# Patient Record
Sex: Female | Born: 1955
Health system: Southern US, Community
[De-identification: ages and names within clinical notes are randomized; demographics above are authoritative.]

---

## 1976-04-04 HISTORY — PX: BREAST BIOPSY: SHX20

## 2003-09-08 ENCOUNTER — Other Ambulatory Visit: Payer: Self-pay

## 2006-04-26 ENCOUNTER — Ambulatory Visit: Payer: Self-pay

## 2006-09-11 ENCOUNTER — Ambulatory Visit: Payer: Self-pay | Admitting: Family Medicine

## 2007-07-06 ENCOUNTER — Encounter: Admission: RE | Admit: 2007-07-06 | Discharge: 2007-09-04 | Payer: Self-pay | Admitting: Anesthesiology

## 2007-07-10 ENCOUNTER — Ambulatory Visit: Payer: Self-pay | Admitting: Anesthesiology

## 2007-09-04 ENCOUNTER — Ambulatory Visit: Payer: Self-pay | Admitting: Anesthesiology

## 2008-06-20 ENCOUNTER — Encounter: Admission: RE | Admit: 2008-06-20 | Discharge: 2008-06-27 | Payer: Self-pay | Admitting: Anesthesiology

## 2008-06-24 ENCOUNTER — Ambulatory Visit: Payer: Self-pay | Admitting: Anesthesiology

## 2008-07-10 ENCOUNTER — Emergency Department: Payer: Self-pay | Admitting: Emergency Medicine

## 2009-03-03 ENCOUNTER — Ambulatory Visit: Payer: Self-pay

## 2009-03-09 ENCOUNTER — Ambulatory Visit: Payer: Self-pay

## 2009-03-19 ENCOUNTER — Ambulatory Visit: Payer: Self-pay | Admitting: Surgery

## 2009-03-26 ENCOUNTER — Ambulatory Visit: Payer: Self-pay

## 2010-03-03 ENCOUNTER — Ambulatory Visit: Payer: Self-pay

## 2010-08-17 NOTE — Assessment & Plan Note (Signed)
Joanna Paul comes to [facility] today to evaluate her.  I  reviewed the health and history form, 14-point review of systems.  Her  permission case manager to the room.   1. Joanna Paul has just completed her school.  She is waiting for another      semester and doing well here.  2. Aquatic therapy will be formalized and continue.  3. We will follow her in a conservative management arena.  We will      discontinue the Flector and Ultram as she had problems with that      and we will go ahead and trial Celebrex as she does well with      NSAIDs, and for the next 2 or 3 months will utilize that as her      primary pain control.  I had also discussed home-based therapy.  I      do not think a TENS unit is necessary, essentially at MMI from my      perspective.   Objectively, her knee is stable.  Good range of motion.  No pseudomotor  changes.  Nothing new neurologically.   IMPRESSION:  Osteoarthritis of the knee.   PLAN:  Conservative management.  Follow it p.r.n.  MMI.           ______________________________  [name], MD     HH/MedQ  D:  09/04/2007 11:09:23  T:  09/04/2007 12:17:29  Job #:  [id number]

## 2010-08-17 NOTE — Assessment & Plan Note (Signed)
Joanna Paul comes to [facility] today.  I evaluated her  and reviewed the Health and History form and 14-point review of systems.  1. She comes today and I examined her, case manager to the room with      her permission.  2. I examined her knees, really not a lot of changes here, I reviewed      available notes from our consulting physicians, and the      orthopedists and it does not appear further surgery is planned; in      fact, she does not really wanted.  3. She has not really changed much in the past year, it has been very      stable and I think from our perspective, she is at MMI, which does      not necessarily need withdrawal care as we explained, but we will      go ahead and continue the Flector, follow her expectantly      essentially p.r.n. and may be transition to primary care for her      Flector patches.  I relate to her primary care should follow liver      function, kidney function for prolonged and said exposure.  4. Other modifiable features in health profile discussed.  She      continues in school.  She would like to go onto her masters and      this is fine, good goal-oriented approach.  We will remain      available for her should she have any problems.   Objectively, the knee reveals no instability, no discoloration, nothing  new neurologically.   IMPRESSION:  Osteoarthritis of the knee.   PLAN:  Conservative management, MMI.  Follow up p.r.n.           ______________________________  [name], MD     HH/MedQ  D:  06/24/2008 11:25:55  T:  06/25/2008 00:25:34  Job #:  [id number]

## 2010-08-17 NOTE — Assessment & Plan Note (Signed)
FOLLOW-UP VISIT:  Joanna Paul comes in for pain management today to  evaluate her with often history performed 14-point review of systems.  She is examined and with her permission, Joanna Griffith, RN, CCM, is  brought to the room.  She is a, the patient is a very pleasant  individual, 55 years old, who apparently was involved in an incident at  work where she injured her right knee, and shin.  7/10, followed by Dr.  [name], and he has completed assessment and evaluation.  She is  referred to [place] for consideration of pain control.  She does not describe  any classic pseudomotor changes.  She has been released from Dr.  [name].  The patient basically is at full duty, and having some  situational pain, underlying diagnosis chondromalacia.  She does not  describe elements of CRTS.  She has had knee injections, and a number of  the conservative treatments, and she is not planned for arthroscopic  evaluation at this time.  She continues to improve slowly.  Her pain is  7/10.  Most activities interfere with her pain.  She has trialed  orthopedist, she has not seen other providers.  She has had x-rays.  It  is described as sometimes burning, sometimes sharp, sometimes stabbing  and stiffness.  The pain is constant.  She is in school, she is a very  active individual, she wants to continue to be very functional.  She  states that she probable wants to go into some kind of law.  She has  adequate mobility.  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  14-point  review of systems and past medical history is otherwise benign.   SOCIAL HISTORY:  She is single, lives with her family.  Otherwise  noncontributory to the pain problem.   FAMILY HISTORY:  Family history is remarkable for diabetes.  Otherwise  noncontributory to the pain problem.   REVIEW OF SYSTEMS:  Otherwise noncontributory to the pain problem.   PHYSICAL EXAMINATION:  GENERAL:  A pleasant female sitting comfortably  in bed.  Gait,  affect, appearance is normal.  Oriented x3.  HEENT:  Is unremarkable.  CHEST:  Clear to auscultation and percussion.  Regular rate and rhythm,  without rub, murmur, or gallop.  ABDOMINAL EXAM:  Soft, nontender, benign.  No hepatosplenomegaly.  She  has diffuse external myofascial discomfort probably from altered gait,  Forton test positive.  EXTREMITIES:  Right knee is stable.  No evidence of pseudomotor changes,  effusion, some patellar discomfort, particularly at full extension.  I  do not see anything new neurologically.  Good peripheral vascular  integrity.   IMPRESSION:  Costochondritis, chondromalacia.   PLAN:  1. Conservative management.  Initiate Flector patch.  Maintain non-      narcotic medication alternatives.  Ultram is chosen.  I would like      to see her get into some aquatic and some therapies that are non-      impact-oriented.  I have reviewed this with her.  2. Follow her expectantly.  I imagine she is pretty much at MMI.  3. Did not plan any further imaging or diagnosis.  Will see how she is      doing in one month and determine further course of care.  Questions      are answered.  Discussed in lay terms.  Reviewed with she and the      case manager.  ______________________________  [name], MD     HH/MedQ  D:  07/10/2007 13:00:28  T:  07/10/2007 16:24:45  Job #:  [id number]

## 2011-03-08 ENCOUNTER — Ambulatory Visit: Payer: Self-pay | Admitting: Family

## 2012-06-27 ENCOUNTER — Ambulatory Visit: Payer: Self-pay | Admitting: Family

## 2012-08-17 ENCOUNTER — Emergency Department: Payer: Self-pay | Admitting: Emergency Medicine

## 2012-08-27 ENCOUNTER — Emergency Department: Payer: Self-pay | Admitting: Emergency Medicine

## 2013-06-11 ENCOUNTER — Ambulatory Visit: Payer: Self-pay | Admitting: Nurse Practitioner

## 2013-06-17 ENCOUNTER — Ambulatory Visit: Payer: Self-pay | Admitting: Nurse Practitioner

## 2013-06-28 ENCOUNTER — Ambulatory Visit: Payer: Self-pay | Admitting: Family

## 2014-03-22 IMAGING — CR DG ABDOMEN 1V
1 series · 2 of 2 positions shown · non-contrast
Comparison: None.

CLINICAL DATA: Abdominal pain, constipation.

EXAM:
ABDOMEN - 1 VIEW

[Series 1: t abdomen supine · 0.14mm/px · 2 of 2 slices shown]
[im 1/2]
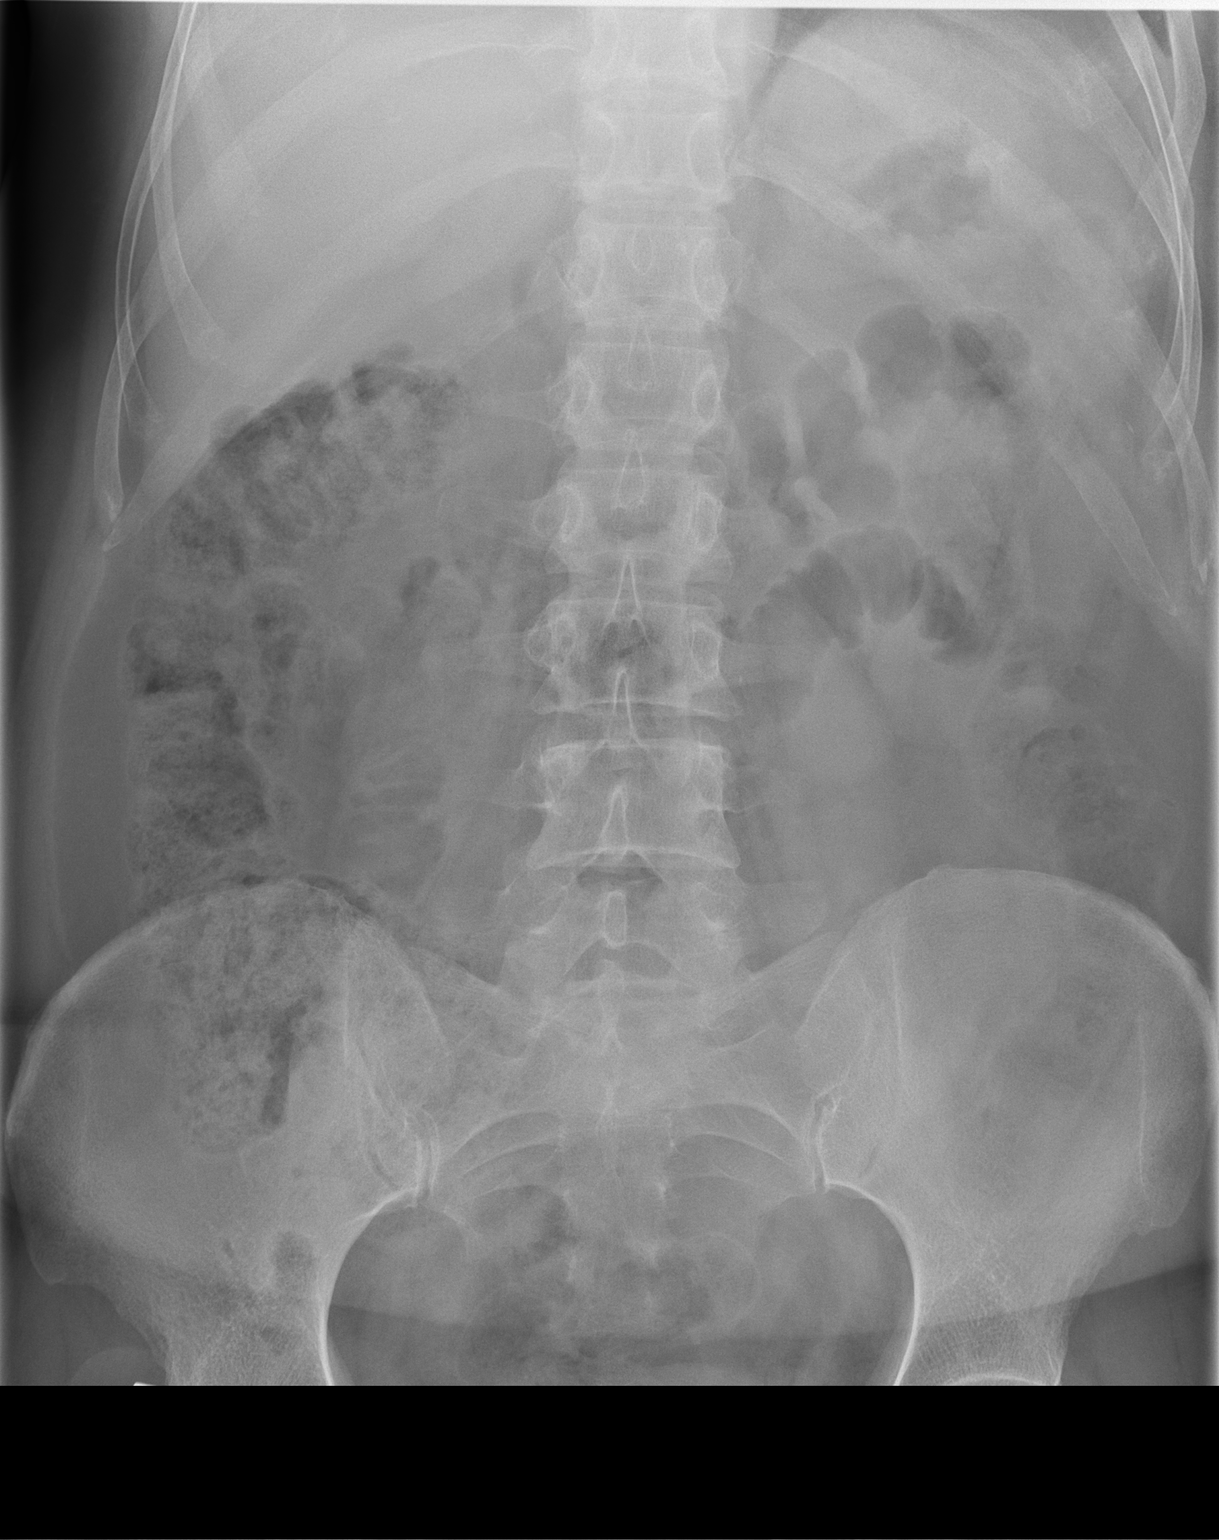
[im 2/2]
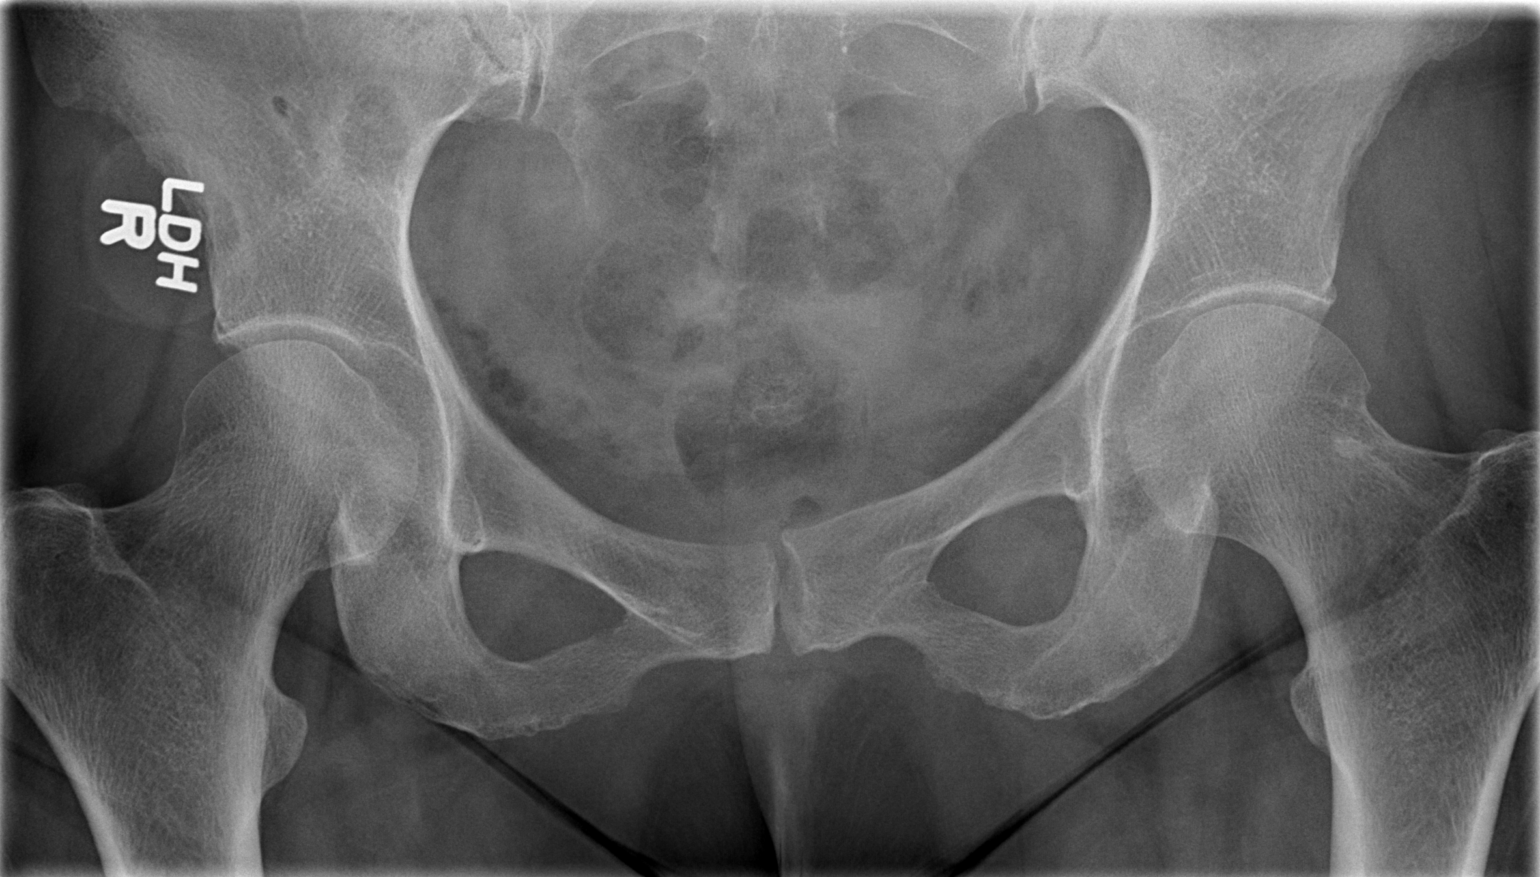

[2 of 2 positions shown; findings below may reference images not displayed]

FINDINGS: Moderate stool burden in the right side of the colon. Gas within
nondistended large and small bowel. No evidence of bowel obstruction
or free air. No organomegaly or suspicious calcification. No acute
bony abnormality.
IMPRESSION: Moderate stool burden in the right colon.  No acute findings.

## 2014-04-19 ENCOUNTER — Encounter (HOSPITAL_COMMUNITY): Payer: Self-pay | Admitting: Cardiology

## 2014-04-19 ENCOUNTER — Emergency Department (HOSPITAL_COMMUNITY)
Admission: EM | Admit: 2014-04-19 | Discharge: 2014-04-19 | Disposition: A | Discharge status: Home / Self Care | Payer: Worker's Compensation | Attending: Emergency Medicine | Admitting: Emergency Medicine

## 2014-04-19 DIAGNOSIS — R531 Weakness: Secondary | ICD-10-CM | POA: Insufficient documentation

## 2014-04-19 DIAGNOSIS — M7989 Other specified soft tissue disorders: Secondary | ICD-10-CM | POA: Insufficient documentation

## 2014-04-19 DIAGNOSIS — R5383 Other fatigue: Secondary | ICD-10-CM | POA: Insufficient documentation

## 2014-04-19 DIAGNOSIS — T50Z95A Adverse effect of other vaccines and biological substances, initial encounter: Secondary | ICD-10-CM | POA: Insufficient documentation

## 2014-04-19 DIAGNOSIS — Y998 Other external cause status: Secondary | ICD-10-CM | POA: Insufficient documentation

## 2014-04-19 DIAGNOSIS — R600 Localized edema: Secondary | ICD-10-CM | POA: Insufficient documentation

## 2014-04-19 DIAGNOSIS — T7840XA Allergy, unspecified, initial encounter: Secondary | ICD-10-CM

## 2014-04-19 DIAGNOSIS — Y9389 Activity, other specified: Secondary | ICD-10-CM | POA: Insufficient documentation

## 2014-04-19 DIAGNOSIS — F419 Anxiety disorder, unspecified: Secondary | ICD-10-CM | POA: Insufficient documentation

## 2014-04-19 DIAGNOSIS — Y9289 Other specified places as the place of occurrence of the external cause: Secondary | ICD-10-CM | POA: Insufficient documentation

## 2014-04-19 DIAGNOSIS — Z880 Allergy status to penicillin: Secondary | ICD-10-CM | POA: Insufficient documentation

## 2014-04-19 MED ORDER — DIPHENHYDRAMINE HCL 25 MG PO TABS: 25.0000 mg | ORAL_TABLET | Freq: Four times a day (QID) | ORAL | Status: AC

## 2014-04-19 MED ORDER — FAMOTIDINE 20 MG PO TABS: 20.0000 mg | ORAL_TABLET | Freq: Two times a day (BID) | ORAL | Status: AC

## 2014-04-19 NOTE — ED Notes (Signed)
Pt reports that she had a hep B vaccine on he 12th and thinks that she may have had a reaction to this. Pt reports that she has felt weak and some calf pain as well.

## 2014-04-19 NOTE — ED Provider Notes (Signed)
CSN: [id number]     Arrival date & time 04/19/14  1842 History   This chart was scribed for non-physician practitioner working with Ethelda Chick, MD by Angelene Giovanni, ED Scribe. The patient was seen in room TR06C/TR06C and the patient's care was started at 7:07 PM     Chief Complaint  Patient presents with  . Allergic Reaction   The history is provided by the patient. No language interpreter was used.   HPI Comments: Joanna Paul is a 59 y.o. female who presents to the Emergency Department complaining of a possible allergic reaction to a Hepatitis B vaccine that she received 4 days ago. She reports associated facial swelling onset the day after receiving the vaccine. She states that she noticed leg and feet swelling and pain with "small red dots" all over her leg. She also reports a sensation of a swollen tongue and weakness. She reports taking Benadryl yesterday which resolved the spots on the leg. She denies wheezing or difficulty breathing. She adds that she could possibly be allergic to Penicillin. She denies any food allergies. She states that whenever she gets a Tetanus shot, her arm is swollen and red.  History reviewed. No pertinent past medical history. History reviewed. No pertinent past surgical history. History reviewed. No pertinent family history. History  Substance Use Topics  . Smoking status: Never Smoker   . Smokeless tobacco: Not on file  . Alcohol Use: No   OB History    No data available     Review of Systems  Constitutional: Positive for fatigue. Negative for fever.  HENT: Negative for facial swelling and trouble swallowing.   Eyes: Negative for redness.  Respiratory: Negative for shortness of breath, wheezing and stridor.   Cardiovascular: Positive for leg swelling. Negative for chest pain.  Gastrointestinal: Negative for nausea and vomiting.  Musculoskeletal: Negative for myalgias.  Skin: Positive for rash.  Neurological: Negative for light-headedness.   Psychiatric/Behavioral: Negative for confusion.      Allergies  Penicillins  Home Medications   Prior to Admission medications   Not on File   BP 141/77 mmHg  Pulse 83  Temp(Src) 97.9 F (36.6 C)  Resp 18  SpO2 93% Physical Exam  Constitutional: She is oriented to person, place, and time. She appears well-developed and well-nourished. No distress.  HENT:  Head: Normocephalic and atraumatic.  Mouth/Throat: Oropharynx is clear and moist.  No tongue swelling or angioedema.  Eyes: Conjunctivae and EOM are normal. Pupils are equal, round, and reactive to light.  Neck: Normal range of motion. Neck supple. No tracheal deviation present.  Cardiovascular: Normal rate.   Pulmonary/Chest: Effort normal. No respiratory distress.  Musculoskeletal: Normal range of motion. She exhibits no edema or tenderness.  Bilateral lower extremities: No lower extremity edema. No significant tenderness. No signs of cellulitis. No urticaria or petechiae noted.  Neurological: She is alert and oriented to person, place, and time.  Skin: Skin is warm and dry.  Psychiatric: Her behavior is normal. Her mood appears anxious.  Nursing note and vitals reviewed.   ED Course  Procedures (including critical care time) DIAGNOSTIC STUDIES: Oxygen Saturation is 93% on RA, adequate by my interpretation.    COORDINATION OF CARE: 7:16 PM- Pt advised of plan for treatment and pt agrees.    Labs Review Labs Reviewed - No data to display  Imaging Review No results found.   EKG Interpretation None       7:22 PM Patient seen and examined. Will treat  with Benadryl and Pepcid 3 days.   Vital signs reviewed and are as follows: BP 141/77 mmHg  Pulse 83  Temp(Src) 97.9 F (36.6 C)  Resp 18  SpO2 93%  Patient encouraged to call 911 if she has trouble breathing or noticeable tongue swelling.   MDM   Final diagnoses:  Allergic reaction, initial encounter   Patient with possible allergic reaction to  her recent hepatitis B vaccine. Currently no objective symptoms on exam. Patient's symptoms consisted of rash, subjective tongue swelling. Patient never had any shortness of breath or difficulty breathing. I do not think that this represents a delayed anaphylactic reaction.  Patient has complained of calf tenderness and swelling with rash. She currently has no signs and symptoms of DVT or cellulitis. I do not feel that an ultrasound is needed at this time. No h/o DVT. No hormone use.   I personally performed the services described in this documentation, which was scribed in my presence. The recorded information has been reviewed and is accurate.     Renne CriglerJoshua Arhaan Chesnut, PA-C 04/19/14 1924  Ethelda ChickMartha K Linker, MD 04/19/14 [phone]

## 2014-04-19 NOTE — Discharge Instructions (Signed)
Please read and follow all provided instructions.  Your diagnoses today include:  1. Allergic reaction, initial encounter    Tests performed today include:  Vital signs. See below for your results today.   Medications prescribed:   Benadryl (diphenhydramine) - antihistamine  You can find this medication over-the-counter.   DO NOT exceed:   50mg  Benadryl every 6 hours    Benadryl will make you drowsy. DO NOT drive or perform any activities that require you to be awake and alert if taking this.   Pepcid (famotidine) - antihistamine  You can find this medication over-the-counter.   DO NOT exceed:   20mg  Pepcid every 12 hours  Take any prescribed medications only as directed.  Home care instructions:   Follow any educational materials contained in this packet  Follow-up instructions: Please follow-up with your primary care provider in the next 3 days for further evaluation of your symptoms.   Return instructions:   Please return to the Emergency Department if you experience worsening symptoms.   Call 9-1-1 immediately if you have an allergic reaction that involves your lips, mouth, throat or if you have any difficulty breathing. This is a life-threatening emergency.   Please return if you have any other emergent concerns.  Additional Information:  Your vital signs today were: BP 141/77 mmHg   Pulse 83   Temp(Src) 97.9 F (36.6 C)   Resp 18   SpO2 93% If your blood pressure (BP) was elevated above 135/85 this visit, please have this repeated by your doctor within one month. --------------

## 2014-11-10 ENCOUNTER — Emergency Department
Admission: EM | Admit: 2014-11-10 | Discharge: 2014-11-10 | Disposition: A | Discharge status: Home / Self Care | Payer: Worker's Compensation | Attending: Emergency Medicine | Admitting: Emergency Medicine

## 2014-11-10 ENCOUNTER — Emergency Department: Payer: Worker's Compensation

## 2014-11-10 ENCOUNTER — Encounter: Payer: Self-pay | Admitting: Emergency Medicine

## 2014-11-10 DIAGNOSIS — Y990 Civilian activity done for income or pay: Secondary | ICD-10-CM | POA: Diagnosis not present

## 2014-11-10 DIAGNOSIS — J0100 Acute maxillary sinusitis, unspecified: Secondary | ICD-10-CM | POA: Insufficient documentation

## 2014-11-10 DIAGNOSIS — W51XXXA Accidental striking against or bumped into by another person, initial encounter: Secondary | ICD-10-CM | POA: Insufficient documentation

## 2014-11-10 DIAGNOSIS — Y9389 Activity, other specified: Secondary | ICD-10-CM | POA: Insufficient documentation

## 2014-11-10 DIAGNOSIS — Y92128 Other place in nursing home as the place of occurrence of the external cause: Secondary | ICD-10-CM | POA: Diagnosis not present

## 2014-11-10 DIAGNOSIS — Z880 Allergy status to penicillin: Secondary | ICD-10-CM | POA: Insufficient documentation

## 2014-11-10 DIAGNOSIS — Z79899 Other long term (current) drug therapy: Secondary | ICD-10-CM | POA: Insufficient documentation

## 2014-11-10 DIAGNOSIS — S0083XA Contusion of other part of head, initial encounter: Secondary | ICD-10-CM | POA: Diagnosis not present

## 2014-11-10 DIAGNOSIS — S0993XA Unspecified injury of face, initial encounter: Secondary | ICD-10-CM | POA: Diagnosis present

## 2014-11-10 MED ORDER — LEVOFLOXACIN 500 MG PO TABS: 500.0000 mg | ORAL_TABLET | Freq: Every day | ORAL | Status: AC

## 2014-11-10 MED ORDER — IBUPROFEN 800 MG PO TABS: 800.0000 mg | ORAL_TABLET | Freq: Three times a day (TID) | ORAL | Status: DC | PRN

## 2014-11-10 MED ORDER — PSEUDOEPHEDRINE HCL 60 MG PO TABS
60.0000 mg | ORAL_TABLET | ORAL | Status: AC | PRN
Start: 2014-11-10 — End: ?

## 2014-11-10 MED ORDER — LIDOCAINE VISCOUS 2 % MT SOLN: 20.0000 mL | OROMUCOSAL | Status: AC | PRN
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
## 2014-11-10 NOTE — Discharge Instructions (Signed)
Contusion A contusion is a deep bruise. Contusions are the result of an injury that caused bleeding under the skin. The contusion may turn blue, purple, or yellow. Minor injuries will give you a painless contusion, but more severe contusions may stay painful and swollen for a few weeks.  CAUSES  A contusion is usually caused by a blow, trauma, or direct force to an area of the body. SYMPTOMS   Swelling and redness of the injured area.  Bruising of the injured area.  Tenderness and soreness of the injured area.  Pain. DIAGNOSIS  The diagnosis can be made by taking a history and physical exam. An X-ray, CT scan, or MRI may be needed to determine if there were any associated injuries, such as fractures. TREATMENT  Specific treatment will depend on what area of the body was injured. In general, the best treatment for a contusion is resting, icing, elevating, and applying cold compresses to the injured area. Over-the-counter medicines may also be recommended for pain control. Ask your caregiver what the best treatment is for your contusion. HOME CARE INSTRUCTIONS   Put ice on the injured area.  Put ice in a plastic bag.  Place a towel between your skin and the bag.  Leave the ice on for 15-20 minutes, 3-4 times a day, or as directed by your health care provider.  Only take over-the-counter or prescription medicines for pain, discomfort, or fever as directed by your caregiver. Your caregiver may recommend avoiding anti-inflammatory medicines (aspirin, ibuprofen, and naproxen) for 48 hours because these medicines may increase bruising.  Rest the injured area.  If possible, elevate the injured area to reduce swelling. SEEK IMMEDIATE MEDICAL CARE IF:   You have increased bruising or swelling.  You have pain that is getting worse.  Your swelling or pain is not relieved with medicines. MAKE SURE YOU:   Understand these instructions.  Will watch your condition.  Will get help right  away if you are not doing well or get worse. Document Released: 12/29/2004 Document Revised: 03/26/2013 Document Reviewed: 01/24/2011 [facility] Patient Information 2015 [place], [place]. This information is not intended to replace advice given to you by your health care provider. Make sure you discuss any questions you have with your health care provider.  Mandibular Contusion A mandibular contusion is a deep bruise of your jaw. Contusions are the result of an injury that caused bleeding under the skin. The contusion may turn blue, purple, or yellow. Minor injuries will give you a painless contusion, but more severe contusions may stay painful and swollen for a few weeks.  CAUSES A mandibular contusion comes from a direct force to that area, such as falling or a punch to the jaw. SYMPTOMS   Jaw pain.  Jaw swelling.  Jaw bruising.  Jaw tenderness. DIAGNOSIS  The diagnosis can be made by taking your history and doing a physical exam. You may need an X-ray of your jaw to look for a broken bone (fracture). TREATMENT Often, the best treatment for a mandibular contusion is applying cold compresses to the injured area and eating a soft diet. Over-the-counter medicines may also be recommended for pain control.  HOME CARE INSTRUCTIONS   Put ice on the injured area.  Put ice in a plastic bag.  Place a towel between your skin and the bag.  Leave the ice on for 15-20 minutes, 03-04 times a day.  Eat soft foods for 1 week. Soft foods include baby food, gelatin, cooked cereal, ice cream, applesauce,  ice in a plastic bag.  ¨ Place a towel between your skin and the bag.  ¨ Leave the ice on for 15-20 minutes, 03-04 times a day.  · Eat soft foods for 1 week. Soft foods include baby food, gelatin, cooked cereal, ice cream, applesauce, bananas, eggs, pasta, cottage cheese, soups, and yogurt. Cut food into smaller pieces for less chewing. Avoid chewing gum or ice.  · Only take over-the-counter or prescription medicines for pain, discomfort, or fever as directed by your caregiver.  · Avoid opening your mouth widely. This includes opening your mouth to eat large pieces of food or to yawn, scream, yell, or sing.  SEEK IMMEDIATE MEDICAL CARE  IF:   · Your swelling or pain is not relieved with medicines.  · You are not improving.  · You have any cracking or clicking (crepitation) in the jaw joint.  MAKE SURE YOU:   · Understand these instructions.  · Will watch your condition.  · Will get help right away if you are not doing well or get worse.  Document Released: 06/11/2003 Document Revised: 06/13/2011 Document Reviewed: 02/04/2011  [employer]® Patient Information ©2015 [employer]. This information is not intended to replace advice given to you by your health care provider. Make sure you discuss any questions you have with your health care provider.

## 2016-02-23 ENCOUNTER — Telehealth (INDEPENDENT_AMBULATORY_CARE_PROVIDER_SITE_OTHER): Payer: Self-pay | Admitting: Orthopedic Surgery

## 2016-02-23 NOTE — Telephone Encounter (Signed)
Joanna Paul w/[employer] ph# [phone], fax# [phone] is the claim adjuster for this patient. Always include claim number in all messages, claim# [id number]. Patient has an appointment tomorrow with Joanna Paul at 2:00

## 2016-02-24 ENCOUNTER — Encounter (INDEPENDENT_AMBULATORY_CARE_PROVIDER_SITE_OTHER): Payer: Self-pay | Admitting: Orthopedic Surgery

## 2016-02-24 ENCOUNTER — Ambulatory Visit (INDEPENDENT_AMBULATORY_CARE_PROVIDER_SITE_OTHER): Payer: Worker's Compensation | Admitting: Orthopedic Surgery

## 2016-02-24 ENCOUNTER — Ambulatory Visit (INDEPENDENT_AMBULATORY_CARE_PROVIDER_SITE_OTHER): Payer: Self-pay

## 2016-02-24 VITALS — Ht 64.0 in | Wt 155.0 lb

## 2016-02-24 DIAGNOSIS — S92355A Nondisplaced fracture of fifth metatarsal bone, left foot, initial encounter for closed fracture: Secondary | ICD-10-CM | POA: Diagnosis not present

## 2016-02-24 DIAGNOSIS — M79672 Pain in left foot: Secondary | ICD-10-CM | POA: Diagnosis not present

## 2016-02-24 DIAGNOSIS — M25572 Pain in left ankle and joints of left foot: Secondary | ICD-10-CM | POA: Diagnosis not present

## 2016-02-24 NOTE — Progress Notes (Signed)
Office Visit Note   Patient: Joanna Paul           Date of Birth: Aug 27, 1955           MRN: [id number] Visit Date: 02/24/2016              Requested by: No referring provider defined for this encounter. PCP: No PCP Per Patient   Assessment & Plan: Visit Diagnoses:  1. Pain in left foot   2. Pain in left ankle and joints of left foot   3. Closed nondisplaced fracture of fifth metatarsal bone of left foot, initial encounter     Plan: Plan for the fracture boot weightbearing as tolerated. Patient is to be out of work from her regular duties for 4 weeks but she may do seated light-duty work. She is given advice regarding elevation ice anti-inflammatories  Repeat 3 view radiographs of the left foot at follow-up.  Follow-Up Instructions: Return in about 4 weeks (around 03/23/2016).   Orders:  Orders Placed This Encounter  Procedures  . XR Ankle Complete Left  . XR Foot Complete Left   No orders of the defined types were placed in this encounter.     Procedures: No procedures performed   Clinical Data: No additional findings.   Subjective: Chief Complaint  Patient presents with  . Left Ankle - Fracture    Patient is here for left ankle evaluation. She had on the job injury while walking, she fell to the floor and felt like her bone was shifting downward. She went to [facility] on date of injury 02/14/16. Xrays were obtained, and she was advised she had left ankle fracture. She was placed in a cam walker, given crutches and advised to be nonweightbearing. She is weightbearing on foot in exam room today, she states she tries her best to be compliant.   Patient states she was walking down the hall at work she twisted and then felt a pop in the lateral aspect of her left foot.  Review of Systems   Objective: Vital Signs: Ht 5\' 4"  (1.626 m)   Wt 155 lb (70.3 kg)   BMI 26.61 kg/m   Physical Exam on examination patient is alert oriented no adenopathy  well-dressed normal affect normal respiratory effort she does have an antalgic gait she has good pulses she has ecchymosis and bruising over the lateral aspect the left foot. Her ankle is essentially nontender to palpation. Anterior drawer stable she has a good pulse. She is point tender to palpation over the fifth metatarsal shaft. This is the area of bruising and swelling. This is the area of the old fracture through the left fifth metatarsal shaft.  Ortho Exam  Specialty Comments:  No specialty comments available.  Imaging: Xr Ankle Complete Left  Result Date: 02/24/2016 Three-view radiographs the left ankle shows a congruent mortise no evidence of fracture she does have a sesamoid over the distal aspect of the fibula but no signs of any avulsion acute fracture.  Xr Foot Complete Left  Result Date: 02/24/2016 Three-view radiographs of the left foot shows a old diaphyseal fracture of the fifth metatarsal. There is no displacement at this time.    PMFS History: Patient Active Problem List   Diagnosis Date Noted  . Closed nondisplaced fracture of fifth left metatarsal bone 02/24/2016   History reviewed. No pertinent past medical history.  History reviewed. No pertinent family history.  History reviewed. No pertinent surgical history. Social History   Occupational  History  . Not on file.   Social History Main Topics  . Smoking status: Never Smoker  . Smokeless tobacco: Never Used  . Alcohol use No  . Drug use: No  . Sexual activity: Not on file

## 2016-02-24 NOTE — Telephone Encounter (Signed)
Patient is low on naproxen (prescribed by another dr.at [facility]) She is requesting a refill  [facility] [address]                              [place]   Cb#: [phone]

## 2016-02-29 ENCOUNTER — Other Ambulatory Visit (INDEPENDENT_AMBULATORY_CARE_PROVIDER_SITE_OTHER): Payer: Self-pay | Admitting: Orthopedic Surgery

## 2016-02-29 MED ORDER — NAPROXEN 500 MG PO TABS: 500.0000 mg | ORAL_TABLET | Freq: Two times a day (BID) | ORAL | 3 refills | Status: DC

## 2016-02-29 NOTE — Telephone Encounter (Signed)
rx written

## 2016-03-01 NOTE — Telephone Encounter (Signed)
Facility called this morning to let Dr Lajoyce Cornersuda know patient needs a naproxen refill. [facility] [address]  Cb# [phone]

## 2016-03-01 NOTE — Telephone Encounter (Signed)
I called and left voicemail this has been addressed and sent into her pharmacy yesterday evening. If they have not received asked she please call back and let us know.

## 2016-03-21 ENCOUNTER — Ambulatory Visit (INDEPENDENT_AMBULATORY_CARE_PROVIDER_SITE_OTHER): Payer: Self-pay

## 2016-03-21 ENCOUNTER — Ambulatory Visit (INDEPENDENT_AMBULATORY_CARE_PROVIDER_SITE_OTHER): Payer: Worker's Compensation | Admitting: Family

## 2016-03-21 VITALS — Ht 64.0 in | Wt 155.0 lb

## 2016-03-21 DIAGNOSIS — M79672 Pain in left foot: Secondary | ICD-10-CM | POA: Diagnosis not present

## 2016-03-21 DIAGNOSIS — M7672 Peroneal tendinitis, left leg: Secondary | ICD-10-CM

## 2016-03-21 DIAGNOSIS — S92355D Nondisplaced fracture of fifth metatarsal bone, left foot, subsequent encounter for fracture with routine healing: Secondary | ICD-10-CM | POA: Diagnosis not present

## 2016-03-21 DIAGNOSIS — S90121A Contusion of right lesser toe(s) without damage to nail, initial encounter: Secondary | ICD-10-CM | POA: Diagnosis not present

## 2016-03-21 DIAGNOSIS — M79671 Pain in right foot: Secondary | ICD-10-CM

## 2016-03-21 MED ORDER — GLUCOSAMINE-CHONDROITIN 500-400 MG PO TABS: 1.0000 | ORAL_TABLET | Freq: Three times a day (TID) | ORAL | 1 refills | Status: AC

## 2016-03-21 MED ORDER — IBUPROFEN 800 MG PO TABS: 800.0000 mg | ORAL_TABLET | Freq: Three times a day (TID) | ORAL | 0 refills | Status: AC | PRN

## 2016-03-21 NOTE — Progress Notes (Signed)
Office Visit Note   Patient: Joanna BaasKathy A Peragine           Date of Birth: 1956-04-04           MRN: [id number] Visit Date: 03/21/2016              Requested by: No referring provider defined for this encounter. PCP: No PCP Per Patient   Assessment & Plan: Visit Diagnoses:  1. Pain in left foot   2. Peroneal tendinitis of lower leg, left   3. Pain in right foot   4. Contusion of fifth toe of right foot, initial encounter   5. Closed nondisplaced fracture of fifth metatarsal bone of left foot with routine healing, subsequent encounter     Plan: Will get her set up for MRI left ankle, rule out peroneal tendon tear. She will continue the fracture boot. Follow up in the office post MRI. Recommended a stiff soled shoe for the right foot.   Follow-Up Instructions: Return p mri .   Orders:  Orders Placed This Encounter  Procedures  . XR Foot Complete Left  . XR Foot Complete Right   Meds ordered this encounter  Medications  . ibuprofen (ADVIL,MOTRIN) 800 MG tablet    Sig: Take 1 tablet (800 mg total) by mouth every 8 (eight) hours as needed.    Dispense:  60 tablet    Refill:  0  . glucosamine-chondroitin (MAX GLUCOSAMINE CHONDROITIN) 500-400 MG tablet    Sig: Take 1 tablet by mouth 3 (three) times daily.    Dispense:  90 tablet    Refill:  1      Procedures: No procedures performed   Clinical Data: No additional findings.   Subjective: Chief Complaint  Patient presents with  . Left Foot - Injury    Closed nondisplaced fracture of fifth metatarsal bone of left foot, initial encounter  DOI 02/29/16    Patient is ambulating with crutches and a fracture boot. States that she was walking with crutches and her foot gave out and she injured her right foot as well. Work comp said that this may be x rayed today as well. She complains of lateral side foot pain and states that she is " 99.9 % sure that its broken" patient does not voice any concerns about the other  side.   Injury     Review of Systems  Constitutional: Negative for chills and fever.     Objective: Vital Signs: Ht 5\' 4"  (1.626 m)   Wt 155 lb (70.3 kg)   BMI 26.61 kg/m   Physical Exam  Constitutional: She is oriented to person, place, and time. She appears well-developed and well-nourished.  Pulmonary/Chest: Effort normal.  Musculoskeletal:       Left ankle: She exhibits swelling. Tenderness. AITFL tenderness found.       Feet:  Tenderness over ATFL and peroneal tendons. Peroneal tendons most painful. There is swelling. No ecchymosis. Pain with resisted eversion.  Neurological: She is alert and oriented to person, place, and time.  Psychiatric: She has a normal mood and affect.  Nursing note reviewed.   Ortho Exam  Specialty Comments:  No specialty comments available.  Imaging: Xr Foot Complete Left  Result Date: 03/21/2016 Three-view radiographs of the left foot are unchanged from prior exam, show a old diaphyseal fracture of the fifth metatarsal. There is no displacement at this time.  Xr Foot Complete Right  Result Date: 03/21/2016 Three view radiographs of the right foot show a  close fracture of the 5th toe proximal phalange without displacement.     PMFS History: Patient Active Problem List   Diagnosis Date Noted  . Closed nondisplaced fracture of fifth left metatarsal bone 02/24/2016   No past medical history on file.  No family history on file.  No past surgical history on file. Social History   Occupational History  . Not on file.   Social History Main Topics  . Smoking status: Never Smoker  . Smokeless tobacco: Never Used  . Alcohol use No  . Drug use: No  . Sexual activity: Not on file

## 2016-03-22 ENCOUNTER — Telehealth (INDEPENDENT_AMBULATORY_CARE_PROVIDER_SITE_OTHER): Payer: Self-pay | Admitting: Orthopedic Surgery

## 2016-03-22 ENCOUNTER — Other Ambulatory Visit (INDEPENDENT_AMBULATORY_CARE_PROVIDER_SITE_OTHER): Payer: Self-pay

## 2016-03-22 MED ORDER — NAPROXEN 500 MG PO TABS: 500.0000 mg | ORAL_TABLET | Freq: Two times a day (BID) | ORAL | 3 refills | Status: AC

## 2016-03-22 NOTE — Telephone Encounter (Signed)
I called and lm on vm for pt to advise that this has been faxed into her pharm.

## 2016-03-22 NOTE — Telephone Encounter (Signed)
Faxed [phone] attn:[facility]

## 2016-03-22 NOTE — Telephone Encounter (Signed)
[facility] mgr) fax [phone]

## 2016-03-22 NOTE — Telephone Encounter (Signed)
PATIENT IS REQUESTING REFILL OF NAPROXEN.  [facility] [address] [facility] [place]  Cb#: [phone]

## 2016-03-23 ENCOUNTER — Other Ambulatory Visit (INDEPENDENT_AMBULATORY_CARE_PROVIDER_SITE_OTHER): Payer: Self-pay | Admitting: Family

## 2016-03-23 DIAGNOSIS — M25562 Pain in left knee: Secondary | ICD-10-CM

## 2016-03-29 ENCOUNTER — Ambulatory Visit
Admission: RE | Admit: 2016-03-29 | Discharge: 2016-03-29 | Disposition: A | Discharge status: Home / Self Care | Payer: Worker's Compensation | Source: Ambulatory Visit | Attending: Family | Admitting: Family

## 2016-03-29 DIAGNOSIS — M25562 Pain in left knee: Secondary | ICD-10-CM

## 2016-04-12 ENCOUNTER — Ambulatory Visit (INDEPENDENT_AMBULATORY_CARE_PROVIDER_SITE_OTHER): Payer: Worker's Compensation | Admitting: Orthopedic Surgery

## 2016-04-12 VITALS — Ht 64.0 in | Wt 155.0 lb

## 2016-04-12 DIAGNOSIS — S93412A Sprain of calcaneofibular ligament of left ankle, initial encounter: Secondary | ICD-10-CM | POA: Insufficient documentation

## 2016-04-12 NOTE — Progress Notes (Signed)
   Office Visit Note   Patient: Joanna Paul           Date of Birth: [birth year]           MRN: [id number] Visit Date: 04/12/2016              Requested by: No referring provider defined for this encounter. PCP: No PCP Per Patient  Chief Complaint  Patient presents with  . Left Ankle - Follow-up    MRI review    HPI: MRI review of left ankle. This is a workers comp case. The pt is walking full weight bearing holding her crutches in a fracture boot.   Autumn L Forrest, RMA   Patient states she was unable to get into her cowboy boots. She complains of pain over the anterior talofibular ligament region. No complaints in the forefoot on either foot. Assessment & Plan: Visit Diagnoses:  1. Sprain of calcaneofibular ligament of left ankle, initial encounter     Plan: We'll plan to advance to an ASO. Patient may increase her activities as tolerated without restrictions. Follow-up as needed. Case was discussed with the workman's comp case Production designer, theatre/television/filmmanager. Patient is given a prescription for physical therapy in [place] at [facility] for range of motion modalities strengthening left ankle 3 times a week for 3 weeks with return to work in 3 weeks without restrictions.  Follow-Up Instructions: Return if symptoms worsen or fail to improve.   Ortho Exam On examination patient is alert oriented no adenopathy well-dressed normal affect normal respiratory effort she ambulates with holding her crutches in 1 hand without using them. Examination she has a stable anterior drawer she has good pulses the deltoid ligament is nontender to palpation the tibia and fibula are nontender to palpation she is tender to palpation of the anterior talofibular ligament. Review of the MRI scan shows some edema in the anterior talofibular ligament complex. The remaining tendons and joint are normal no evidence of fractures. No evidence of an osteochondral defect. No joint effusion. No tendon injury.  Imaging: No  results found.  Orders:  No orders of the defined types were placed in this encounter.  No orders of the defined types were placed in this encounter.    Procedures: No procedures performed  Clinical Data: No additional findings.  Subjective: Review of Systems  Objective: Vital Signs: Ht 5\' 4"  (1.626 m)   Wt 155 lb (70.3 kg)   BMI 26.61 kg/m   Specialty Comments:  No specialty comments available.  PMFS History: Patient Active Problem List   Diagnosis Date Noted  . Sprain of calcaneofibular ligament of left ankle 04/12/2016  . Closed nondisplaced fracture of fifth left metatarsal bone 02/24/2016   No past medical history on file.  No family history on file.  No past surgical history on file. Social History   Occupational History  . Not on file.   Social History Main Topics  . Smoking status: Never Smoker  . Smokeless tobacco: Never Used  . Alcohol use No  . Drug use: No  . Sexual activity: Not on file

## 2016-05-19 ENCOUNTER — Ambulatory Visit (INDEPENDENT_AMBULATORY_CARE_PROVIDER_SITE_OTHER): Payer: Self-pay | Admitting: Orthopedic Surgery

## 2016-05-24 ENCOUNTER — Encounter (INDEPENDENT_AMBULATORY_CARE_PROVIDER_SITE_OTHER): Payer: Self-pay

## 2016-05-24 ENCOUNTER — Encounter (INDEPENDENT_AMBULATORY_CARE_PROVIDER_SITE_OTHER): Payer: Self-pay | Admitting: Orthopedic Surgery

## 2016-05-24 ENCOUNTER — Ambulatory Visit (INDEPENDENT_AMBULATORY_CARE_PROVIDER_SITE_OTHER): Payer: Worker's Compensation | Admitting: Orthopedic Surgery

## 2016-05-24 VITALS — Ht 64.0 in | Wt 155.0 lb

## 2016-05-24 DIAGNOSIS — S93412D Sprain of calcaneofibular ligament of left ankle, subsequent encounter: Secondary | ICD-10-CM

## 2016-05-24 MED ORDER — IBUPROFEN 800 MG PO TABS: 800.0000 mg | ORAL_TABLET | Freq: Three times a day (TID) | ORAL | 0 refills | Status: DC | PRN

## 2016-05-24 NOTE — Progress Notes (Signed)
   Office Visit Note   Patient: Joanna BaasKathy A Gude           Date of Birth: 1955/09/07           MRN: [id number] Visit Date: 05/24/2016              Requested by: No referring provider defined for this encounter. PCP: No PCP Per Patient  Chief Complaint  Patient presents with  . Left Ankle - Follow-up    HPI: Patient is a 61 y.o female who presents for follow up for left ankle. She has been doing therapy but feels she has not made enough improvement. She is accompanied with workers comp Sports coachcase manager today. She has attended therapy visit and states the therapist feels she is unable to return to regular employment. She complains of swelling. She feels she needs a renewal of physical therapy. 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 She states often they are having to work double shifts. Donalee CitrinStepheney L Peele, RT    Assessment & Plan: Visit Diagnoses:  1. Sprain of calcaneofibular ligament of left ankle, subsequent encounter     Plan: Patient has venous stasis swelling and global pain in her foot and ankle she is most tender over the swelling in the forefoot. She is given a prescription to [facility] to obtain a pair of medical compression stockings that she will wear daily. She is to wear the ASO over the socks daily we will call him a prescription for ibuprofen. She is given a note to be out of work for 3 weeks follow-up in 3 weeks for reevaluation.  Follow-Up Instructions: Return in about 3 weeks (around 06/14/2016).   Ortho Exam On examination patient is alert oriented no adenopathy well-dressed normal affect normal respiratory effort she has a normal gait. Patient has hypersensitivity to touch locally around the foot she does have venous stasis swelling tenderness across the forefoot she also has tenderness to palpation of the anterior talofibular ligament she has good ankle and subtalar motion anterior drawer  shows stable anterior talofibular ligament with 1 mm of anterior displacement. Patient was seen with a Workmen's Comp. nurse..  Imaging: No results found.  Orders:  No orders of the defined types were placed in this encounter.  No orders of the defined types were placed in this encounter.    Procedures: No procedures performed  Clinical Data: No additional findings.  Subjective: Review of Systems  Objective: Vital Signs: Ht 5\' 4"  (1.626 m)   Wt 155 lb (70.3 kg)   BMI 26.61 kg/m   Specialty Comments:  No specialty comments available.  PMFS History: Patient Active Problem List   Diagnosis Date Noted  . Sprain of calcaneofibular ligament of left ankle 04/12/2016  . Closed nondisplaced fracture of fifth left metatarsal bone 02/24/2016   History reviewed. No pertinent past medical history.  History reviewed. No pertinent family history.  History reviewed. No pertinent surgical history. Social History   Occupational History  . Not on file.   Social History Main Topics  . Smoking status: Never Smoker  . Smokeless tobacco: Never Used  . Alcohol use No  . Drug use: No  . Sexual activity: Not on file

## 2016-05-31 ENCOUNTER — Telehealth (INDEPENDENT_AMBULATORY_CARE_PROVIDER_SITE_OTHER): Payer: Self-pay | Admitting: Orthopedic Surgery

## 2016-05-31 NOTE — Telephone Encounter (Signed)
Joanna Paul from [facility] called needing verbal orders for additional (PT) for Left ankle. The adjuster is Joanna Paul  [phone]  Fax# [phone]

## 2016-06-01 NOTE — Telephone Encounter (Signed)
We received a fax saying that patients physical therapy was approved for only 4 appointments, denied additional 5 because deemed not medically necessary. They are asking if you want to appeal? This is workman's comp.

## 2016-06-01 NOTE — Telephone Encounter (Signed)
U call patient the fact they approved for physical therapy sessions is a bonus usually physical therapy is not approved for this type of injury.

## 2016-06-01 NOTE — Telephone Encounter (Signed)
Set up for physical therapy at [facility] with range of motion strengthening proprioception of the left ankle. Physical therapy 3 times a week for 3 weeks.

## 2016-06-01 NOTE — Telephone Encounter (Signed)
You saw this pt last week for a left  Sprain of calcaneofibular ligament and had advised compression sock, aso and no work for three weeks. Work comp is calling asking for physical therapy what would you like for them to do with pt.

## 2016-06-02 NOTE — Progress Notes (Signed)
Yes. Please charge level III office visit

## 2016-06-02 NOTE — Telephone Encounter (Signed)
I called and left voicemail for patient for message below, will fax [employer] denial for appeal for additional PT.

## 2016-06-03 NOTE — Telephone Encounter (Signed)
Faxed response to [employer]

## 2016-06-14 ENCOUNTER — Ambulatory Visit (INDEPENDENT_AMBULATORY_CARE_PROVIDER_SITE_OTHER): Payer: Self-pay | Admitting: Orthopedic Surgery

## 2016-06-14 ENCOUNTER — Telehealth (INDEPENDENT_AMBULATORY_CARE_PROVIDER_SITE_OTHER): Payer: Self-pay

## 2016-06-14 NOTE — Telephone Encounter (Signed)
Received call requesting the 05/24/16 and 04/12/16 office notes be faxed to her. Faxed to [phone].

## 2016-06-20 ENCOUNTER — Other Ambulatory Visit (INDEPENDENT_AMBULATORY_CARE_PROVIDER_SITE_OTHER): Payer: Self-pay | Admitting: Orthopedic Surgery

## 2016-06-23 ENCOUNTER — Ambulatory Visit (INDEPENDENT_AMBULATORY_CARE_PROVIDER_SITE_OTHER): Payer: Worker's Compensation | Admitting: Orthopedic Surgery

## 2016-06-23 ENCOUNTER — Encounter (INDEPENDENT_AMBULATORY_CARE_PROVIDER_SITE_OTHER): Payer: Self-pay | Admitting: Orthopedic Surgery

## 2016-06-23 VITALS — Ht 64.0 in | Wt 155.0 lb

## 2016-06-23 DIAGNOSIS — S93412D Sprain of calcaneofibular ligament of left ankle, subsequent encounter: Secondary | ICD-10-CM | POA: Diagnosis not present

## 2016-06-23 NOTE — Progress Notes (Addendum)
   Office Visit Note   Patient: Joanna BaasKathy A Gervase           Date of Birth: 06-07-1955           MRN: [id number] Visit Date: 06/23/2016              Requested by: No referring provider defined for this encounter. PCP: No PCP Per Patient  Chief Complaint  Patient presents with  . Left Ankle - Follow-up    HPI: Patient states she still feels weak has difficulty getting from us seated to a standing position. She has completed her physical therapy she states she's doing well the pain has decreased but she still has pain laterally. Patient was wondering if she could have a cortisone injection.  Assessment & Plan: Visit Diagnoses:  1. Sprain of calcaneofibular ligament of left ankle, subsequent encounter     Plan: Patient is to continue wearing her ASO for at least a month. Recommended against a steroid injection for a lateral ankle sprain. Patient is given a note to return to work on Monday March 26. Patient was seen in consultation with her case manager. Patient has no restrictions at work. In review of the [place] industrial guidelines for permanent partial impairment her permanent partial impairment would be 0% of the left ankle. Patient is at maximal medical improvement.  Follow-Up Instructions: Return if symptoms worsen or fail to improve.   Ortho Exam  Patient is alert, oriented, no adenopathy, well-dressed, normal affect, normal respiratory effort. Patient has an antalgic gait. Examination is a good pulse good skin color and temperature no dystrophic changes she has a very slight amount of swelling she is still tender to palpation of the anterior talofibular ligament ankle joint is nontender no evidence of impingement. Anterior drawer is stable and good ligamentous stability.  Imaging: No results found.  Labs: No results found for: HGBA1C, ESRSEDRATE, CRP, LABURIC, REPTSTATUS, GRAMSTAIN, CULT, LABORGA  Orders:  No orders of the defined types were placed in this  encounter.  No orders of the defined types were placed in this encounter.    Procedures: No procedures performed  Clinical Data: No additional findings.  ROS: Review of Systems  Neurological: Positive for weakness.    Objective: Vital Signs: Ht 5\' 4"  (1.626 m)   Wt 155 lb (70.3 kg)   BMI 26.61 kg/m   Specialty Comments:  No specialty comments available.  PMFS History: Patient Active Problem List   Diagnosis Date Noted  . Sprain of calcaneofibular ligament of left ankle 04/12/2016  . Closed nondisplaced fracture of fifth left metatarsal bone 02/24/2016   No past medical history on file.  No family history on file.  No past surgical history on file. Social History   Occupational History  . Not on file.   Social History Main Topics  . Smoking status: Never Smoker  . Smokeless tobacco: Never Used  . Alcohol use No  . Drug use: No  . Sexual activity: Not on file

## 2016-06-29 ENCOUNTER — Telehealth (INDEPENDENT_AMBULATORY_CARE_PROVIDER_SITE_OTHER): Payer: Self-pay

## 2016-06-29 NOTE — Telephone Encounter (Signed)
Received vm requesting the last office note on this work comp pt. Faxed to [phone]

## 2016-06-30 NOTE — Addendum Note (Signed)
Addended by: Aldean BakerUDA, MARCUS on: 06/30/2016 02:10 PM   Modules accepted: Level of Service

## 2020-01-07 ENCOUNTER — Other Ambulatory Visit: Payer: Self-pay | Admitting: Internal Medicine

## 2020-01-07 DIAGNOSIS — Z1231 Encounter for screening mammogram for malignant neoplasm of breast: Secondary | ICD-10-CM

## 2020-02-04 ENCOUNTER — Ambulatory Visit
Admission: RE | Admit: 2020-02-04 | Discharge: 2020-02-04 | Disposition: A | Discharge status: Home / Self Care | Payer: 59 | Source: Ambulatory Visit | Attending: Internal Medicine | Admitting: Internal Medicine

## 2020-02-04 ENCOUNTER — Other Ambulatory Visit: Payer: Self-pay

## 2020-02-04 DIAGNOSIS — Z1231 Encounter for screening mammogram for malignant neoplasm of breast: Secondary | ICD-10-CM | POA: Insufficient documentation

## 2020-04-24 ENCOUNTER — Other Ambulatory Visit: Payer: Self-pay

## 2020-05-01 ENCOUNTER — Ambulatory Visit: Payer: Self-pay

## 2021-03-11 ENCOUNTER — Other Ambulatory Visit: Payer: Self-pay | Admitting: Internal Medicine

## 2021-03-11 DIAGNOSIS — Z1231 Encounter for screening mammogram for malignant neoplasm of breast: Secondary | ICD-10-CM

## 2021-03-17 ENCOUNTER — Ambulatory Visit
Admission: RE | Admit: 2021-03-17 | Discharge: 2021-03-17 | Disposition: A | Discharge status: Home / Self Care | Payer: 59 | Source: Ambulatory Visit | Attending: Internal Medicine | Admitting: Internal Medicine

## 2021-03-17 ENCOUNTER — Other Ambulatory Visit: Payer: Self-pay

## 2021-03-17 DIAGNOSIS — Z1231 Encounter for screening mammogram for malignant neoplasm of breast: Secondary | ICD-10-CM | POA: Insufficient documentation

## 2021-05-11 ENCOUNTER — Other Ambulatory Visit: Payer: Self-pay

## 2021-05-11 ENCOUNTER — Ambulatory Visit (LOCAL_COMMUNITY_HEALTH_CENTER): Payer: 59

## 2021-05-11 DIAGNOSIS — Z111 Encounter for screening for respiratory tuberculosis: Secondary | ICD-10-CM

## 2021-05-11 NOTE — Progress Notes (Signed)
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 Denies hx of +ppd, but says ppd site typically gets "red". Josie Saunders, RN

## 2021-05-14 ENCOUNTER — Ambulatory Visit (LOCAL_COMMUNITY_HEALTH_CENTER): Payer: 59

## 2021-05-14 ENCOUNTER — Other Ambulatory Visit: Payer: Self-pay

## 2021-05-14 DIAGNOSIS — Z111 Encounter for screening for respiratory tuberculosis: Secondary | ICD-10-CM

## 2021-05-14 LAB — TB SKIN TEST
Induration: 0 mm
TB Skin Test: NEGATIVE

## 2021-05-20 ENCOUNTER — Other Ambulatory Visit: Payer: Self-pay

## 2021-05-20 ENCOUNTER — Ambulatory Visit (LOCAL_COMMUNITY_HEALTH_CENTER): Payer: Self-pay

## 2021-05-20 DIAGNOSIS — Z0184 Encounter for antibody response examination: Secondary | ICD-10-CM

## 2021-05-20 NOTE — Progress Notes (Signed)
Patient in nurse clinic for immunization. States she needs Varicella vaccine for school. Patient states that she had chickenpox as a child. No record of having varicella vaccine as a child. Nurse counseled patient on having a titer to test immunity prior to vaccine as an option. Patient agreeable and chose to have titer drawn. Varicella titer order placed and patient sent to lab. ROI signed. Ann Held, RN

## 2021-05-21 ENCOUNTER — Telehealth: Payer: Self-pay

## 2021-05-21 LAB — VARICELLA ZOSTER ANTIBODY, IGG: Varicella zoster IgG: 819 index (ref >165)

## 2021-05-21 NOTE — Telephone Encounter (Signed)
Varicella titer result = 819 and vaccine not indicated. Call to client to notify her of results and that available for pick up at the [facility] information booth. Client did not answer phone and no voicemail set up. Call to emergency contact (brother) and requested assistance contacting client that lab results available for pick up at [facility]. He states will try to contact client with message a little later today. Jossie Ng, RN

## 2021-05-21 NOTE — Telephone Encounter (Signed)
Encounter opened in error. Clearnce Leja, RN  

## 2021-07-23 LAB — COLOGUARD: COLOGUARD: NEGATIVE

## 2021-12-01 ENCOUNTER — Other Ambulatory Visit: Payer: Self-pay | Admitting: Internal Medicine

## 2021-12-01 DIAGNOSIS — Z1231 Encounter for screening mammogram for malignant neoplasm of breast: Secondary | ICD-10-CM

## 2021-12-20 IMAGING — MG MM DIGITAL SCREENING BILAT W/ TOMO AND CAD
8 series · 8 of 24 positions shown · non-contrast
Comparison: Previous exam(s).

CLINICAL DATA: Screening.

EXAM:
DIGITAL SCREENING BILATERAL MAMMOGRAM WITH TOMOSYNTHESIS AND CAD
TECHNIQUE: Bilateral screening digital craniocaudal and mediolateral oblique
mammograms were obtained. Bilateral screening digital breast
tomosynthesis was performed. The images were evaluated with
computer-aided detection.

[R CC synth-2D]
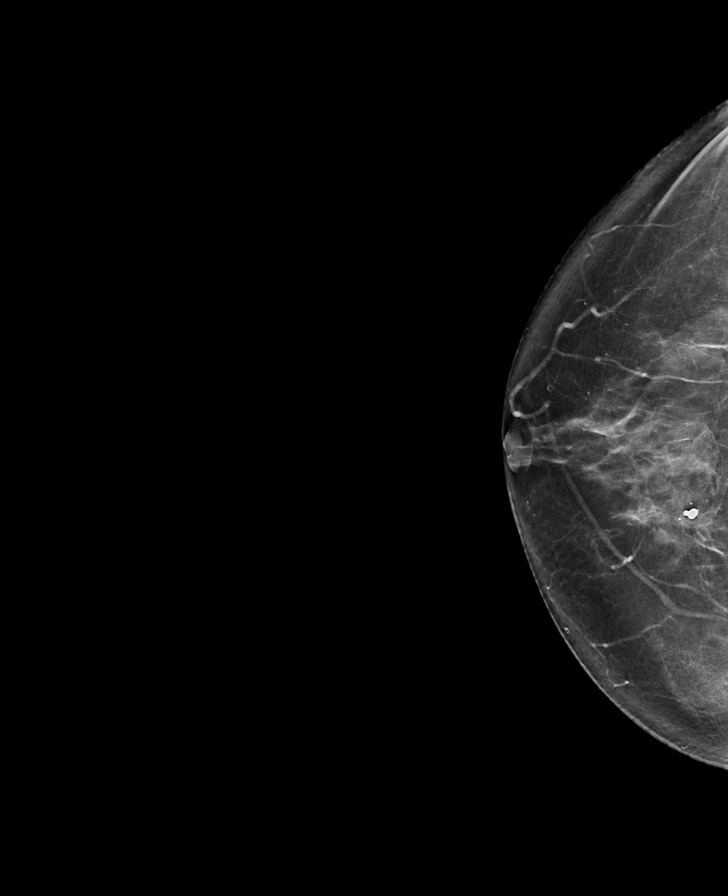

[L CC synth-2D]
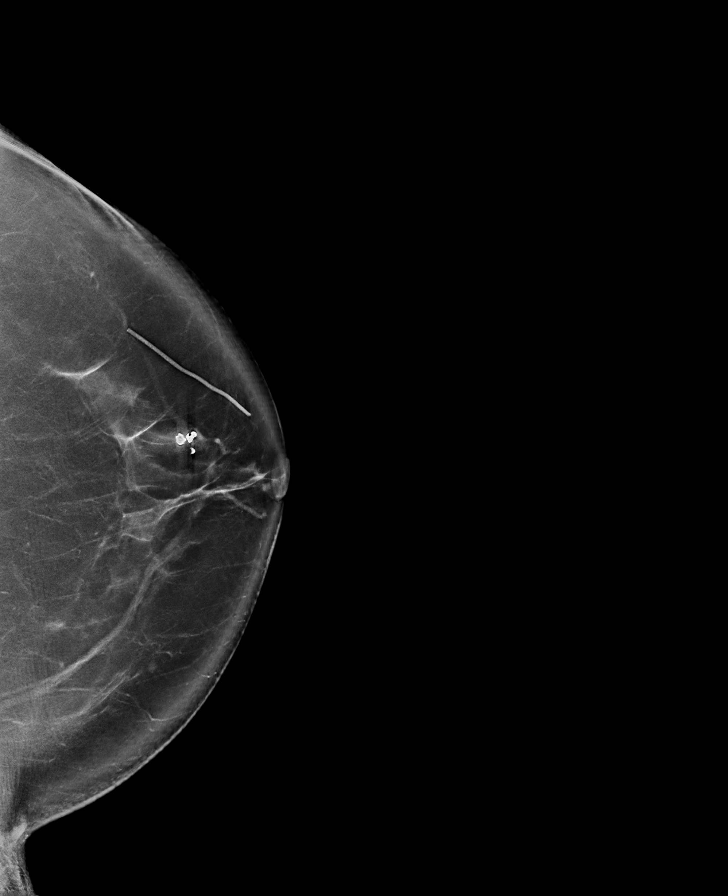

[L MLO synth-2D]
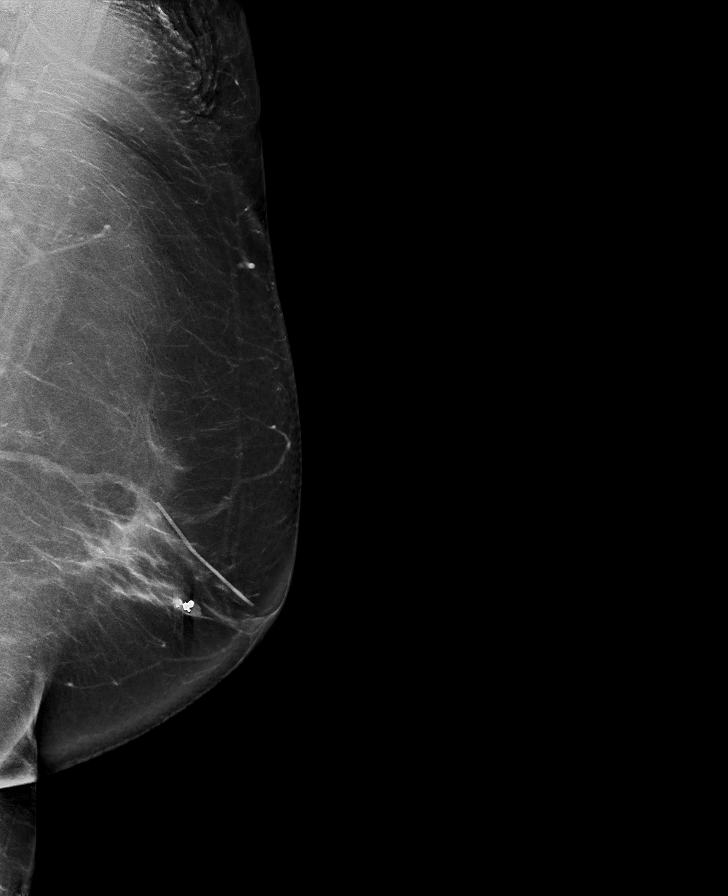

[R MLO synth-2D]
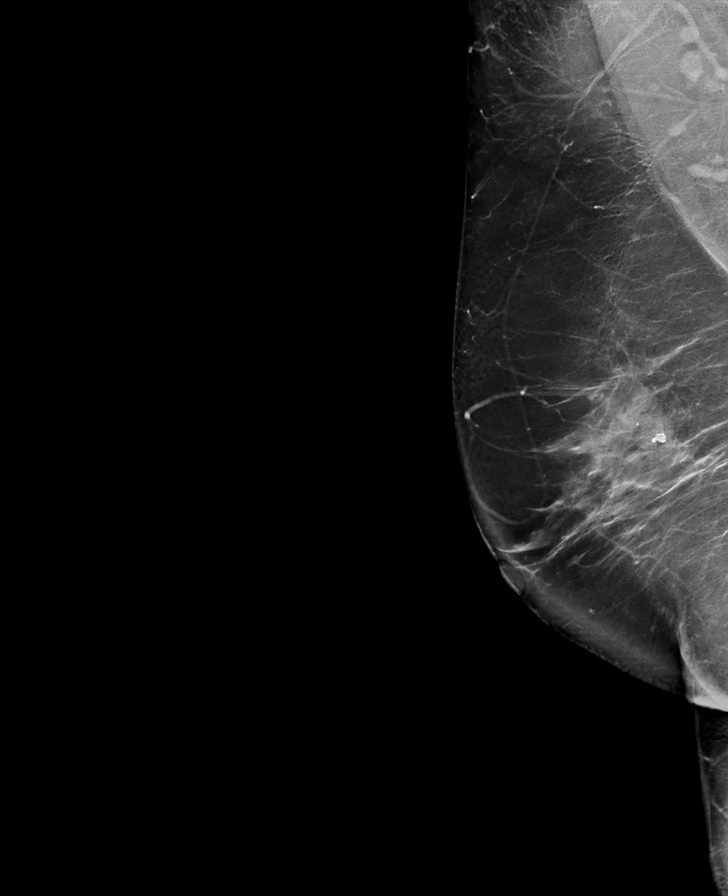

[R CC tomo · tomo slice 39/78.0]
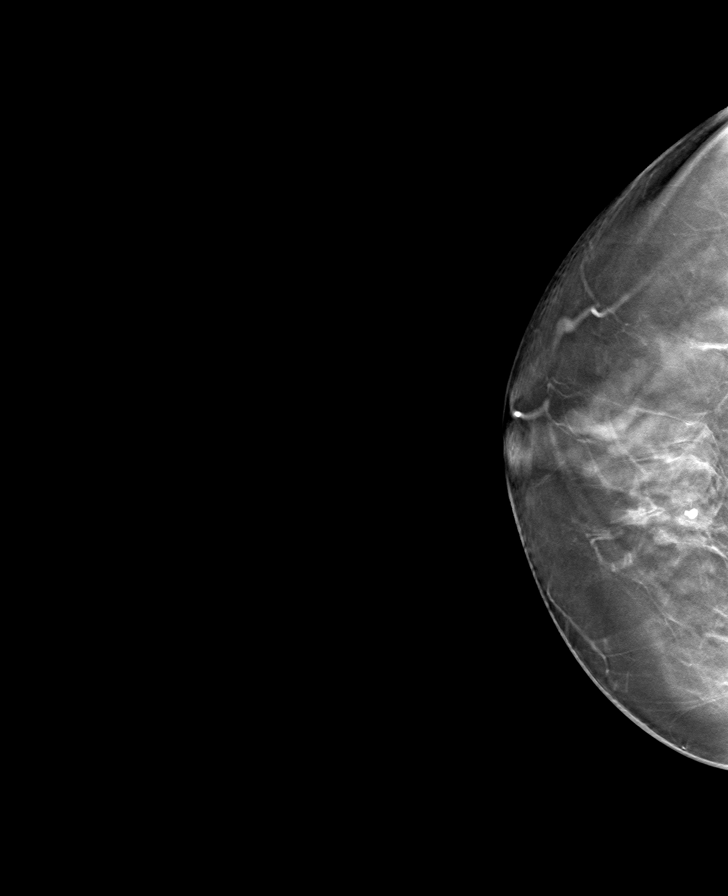

[R MLO tomo · tomo slice 53/104.0]
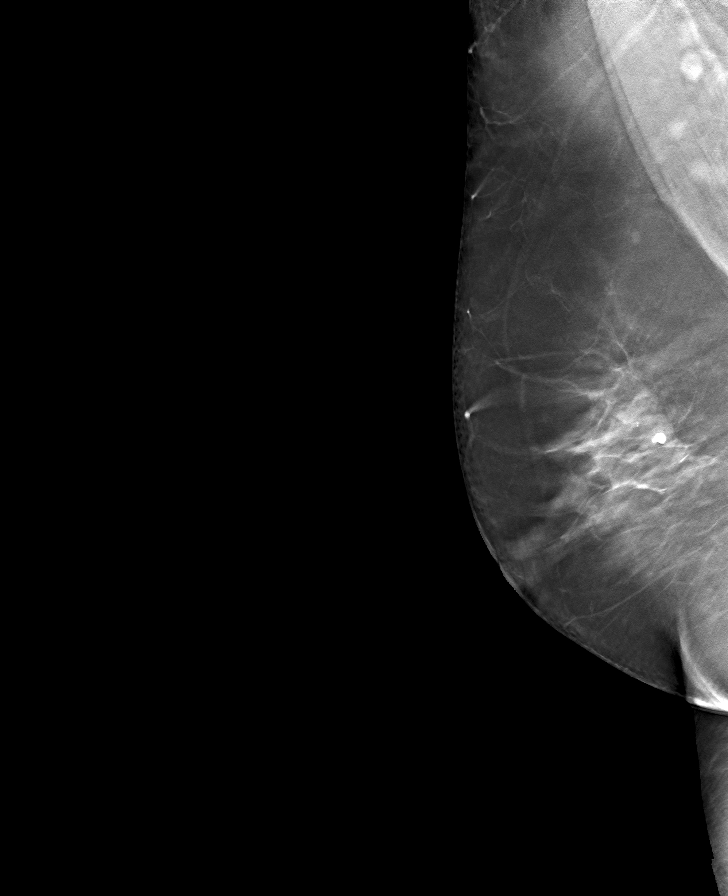

[L MLO tomo · tomo slice 47/92.0]
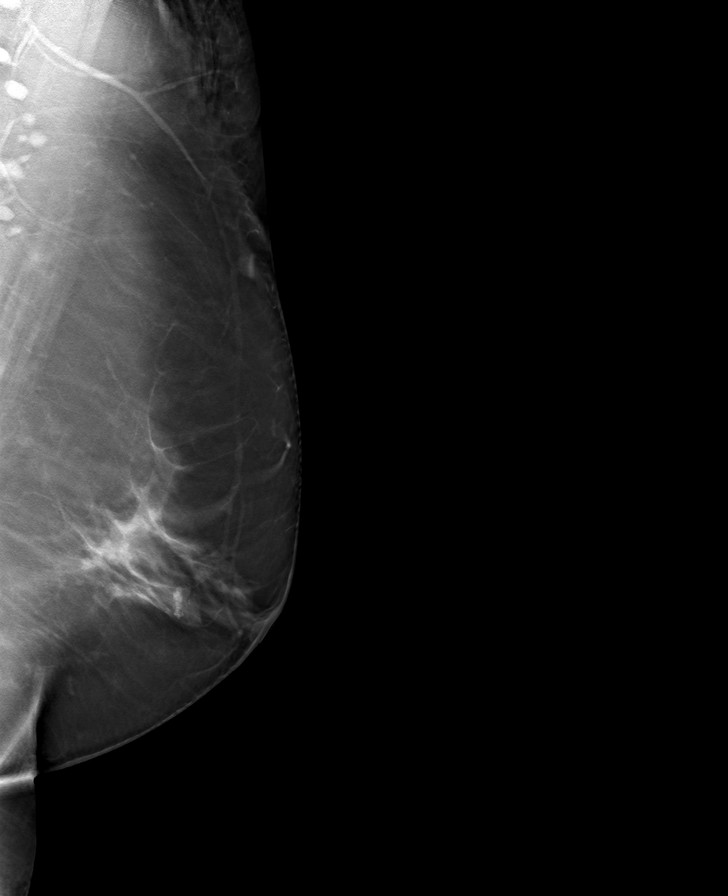

[L CC tomo · tomo slice 51/101.0]
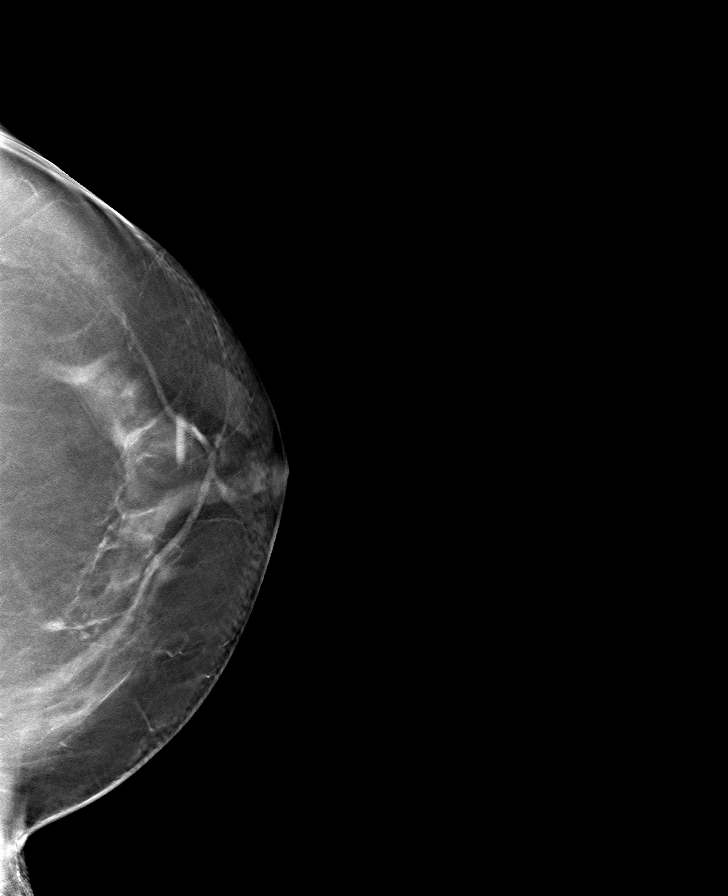

[8 of 24 positions shown; findings below may reference images not displayed]

ACR Breast Density Category b: There are scattered areas of
fibroglandular density.
FINDINGS: There are no findings suspicious for malignancy.
IMPRESSION: No mammographic evidence of malignancy. A result letter of this
screening mammogram will be mailed directly to the patient.

RECOMMENDATION:
Screening mammogram in one year. (Code:51-O-LD2)

BI-RADS CATEGORY  1: Negative.

## 2022-03-24 ENCOUNTER — Ambulatory Visit
Admission: RE | Admit: 2022-03-24 | Discharge: 2022-03-24 | Disposition: A | Discharge status: Home / Self Care | Payer: 59 | Source: Ambulatory Visit | Attending: Internal Medicine | Admitting: Internal Medicine

## 2022-03-24 DIAGNOSIS — Z1231 Encounter for screening mammogram for malignant neoplasm of breast: Secondary | ICD-10-CM | POA: Insufficient documentation

## 2022-09-14 ENCOUNTER — Other Ambulatory Visit: Payer: Self-pay | Admitting: Internal Medicine

## 2022-09-14 DIAGNOSIS — R1011 Right upper quadrant pain: Secondary | ICD-10-CM

## 2022-09-29 ENCOUNTER — Ambulatory Visit
Admission: RE | Admit: 2022-09-29 | Discharge: 2022-09-29 | Disposition: A | Discharge status: Home / Self Care | Payer: 59 | Source: Ambulatory Visit | Attending: Internal Medicine | Admitting: Internal Medicine

## 2022-09-29 DIAGNOSIS — R1011 Right upper quadrant pain: Secondary | ICD-10-CM | POA: Insufficient documentation

## 2022-10-10 ENCOUNTER — Other Ambulatory Visit: Payer: Self-pay | Admitting: Internal Medicine

## 2022-10-10 DIAGNOSIS — R1011 Right upper quadrant pain: Secondary | ICD-10-CM

## 2022-10-10 DIAGNOSIS — G8929 Other chronic pain: Secondary | ICD-10-CM

## 2022-10-18 ENCOUNTER — Ambulatory Visit
Admission: RE | Admit: 2022-10-18 | Discharge: 2022-10-18 | Disposition: A | Discharge status: Home / Self Care | Payer: 59 | Source: Ambulatory Visit | Attending: Internal Medicine | Admitting: Internal Medicine

## 2022-10-18 DIAGNOSIS — R1011 Right upper quadrant pain: Secondary | ICD-10-CM | POA: Insufficient documentation

## 2022-10-18 DIAGNOSIS — R109 Unspecified abdominal pain: Secondary | ICD-10-CM | POA: Diagnosis not present

## 2022-10-18 DIAGNOSIS — G8929 Other chronic pain: Secondary | ICD-10-CM | POA: Insufficient documentation

## 2022-10-18 MED ORDER — IOHEXOL 300 MG/ML  SOLN
100.0000 mL | Freq: Once | INTRAMUSCULAR | Status: AC | PRN
  Administered 2022-10-18: 100 mL via INTRAVENOUS

## 2022-10-20 ENCOUNTER — Ambulatory Visit: Admission: RE | Admit: 2022-10-20 | Payer: 59 | Source: Ambulatory Visit

## 2022-11-14 ENCOUNTER — Other Ambulatory Visit: Payer: Self-pay | Admitting: Surgery

## 2022-11-14 DIAGNOSIS — R140 Abdominal distension (gaseous): Secondary | ICD-10-CM

## 2022-11-22 ENCOUNTER — Encounter: Admission: RE | Admit: 2022-11-22 | Payer: 59 | Source: Ambulatory Visit

## 2023-02-28 ENCOUNTER — Other Ambulatory Visit: Payer: Self-pay | Admitting: Internal Medicine

## 2023-02-28 DIAGNOSIS — Z1231 Encounter for screening mammogram for malignant neoplasm of breast: Secondary | ICD-10-CM

## 2023-04-06 ENCOUNTER — Ambulatory Visit
Admission: RE | Admit: 2023-04-06 | Discharge: 2023-04-06 | Disposition: A | Discharge status: Home / Self Care | Payer: 59 | Source: Ambulatory Visit | Attending: Internal Medicine | Admitting: Internal Medicine

## 2023-04-06 DIAGNOSIS — Z1231 Encounter for screening mammogram for malignant neoplasm of breast: Secondary | ICD-10-CM | POA: Diagnosis present

## 2024-01-26 ENCOUNTER — Other Ambulatory Visit: Payer: Self-pay | Admitting: Internal Medicine

## 2024-01-26 DIAGNOSIS — Z1231 Encounter for screening mammogram for malignant neoplasm of breast: Secondary | ICD-10-CM

## 2024-04-16 ENCOUNTER — Encounter

## 2024-04-18 ENCOUNTER — Ambulatory Visit
Admission: RE | Admit: 2024-04-18 | Discharge: 2024-04-18 | Disposition: A | Discharge status: Home / Self Care | Source: Ambulatory Visit | Attending: Internal Medicine | Admitting: Internal Medicine

## 2024-04-18 DIAGNOSIS — Z1231 Encounter for screening mammogram for malignant neoplasm of breast: Secondary | ICD-10-CM | POA: Diagnosis present
